# Patient Record
Sex: Male | Born: 1995 | Race: Black or African American | Hispanic: No | Marital: Single | Smoking: Never smoker
Health system: Southern US, Community
[De-identification: ages and names within clinical notes are randomized; demographics above are authoritative.]

## PROBLEM LIST (undated history)

## (undated) DIAGNOSIS — K219 Gastro-esophageal reflux disease without esophagitis: Secondary | ICD-10-CM

## (undated) DIAGNOSIS — L0291 Cutaneous abscess, unspecified: Secondary | ICD-10-CM

## (undated) DIAGNOSIS — K649 Unspecified hemorrhoids: Secondary | ICD-10-CM

## (undated) HISTORY — PX: WISDOM TOOTH EXTRACTION: SHX21

## (undated) HISTORY — PX: SEPTOPLASTY: SUR1290

---

## 2015-02-19 ENCOUNTER — Emergency Department (HOSPITAL_COMMUNITY)
Admission: EM | Admit: 2015-02-19 | Discharge: 2015-02-19 | Disposition: A | Discharge status: Home / Self Care | Payer: Self-pay

## 2017-02-16 ENCOUNTER — Other Ambulatory Visit: Payer: Self-pay

## 2017-02-16 ENCOUNTER — Ambulatory Visit (HOSPITAL_COMMUNITY)
Admission: EM | Admit: 2017-02-16 | Discharge: 2017-02-16 | Disposition: A | Discharge status: Home / Self Care | Payer: Federal, State, Local not specified - PPO | Attending: Family Medicine | Admitting: Family Medicine

## 2017-02-16 ENCOUNTER — Encounter (HOSPITAL_COMMUNITY): Payer: Self-pay | Admitting: Emergency Medicine

## 2017-02-16 DIAGNOSIS — L0291 Cutaneous abscess, unspecified: Secondary | ICD-10-CM | POA: Diagnosis not present

## 2017-02-16 MED ORDER — LIDOCAINE HCL 2 % IJ SOLN
INTRAMUSCULAR | Status: AC
  Filled 2017-02-16: qty 20

## 2017-02-16 MED ORDER — LIDOCAINE HCL (PF) 2 % IJ SOLN
INTRAMUSCULAR | Status: AC
  Filled 2017-02-16: qty 2

## 2017-02-16 NOTE — ED Triage Notes (Signed)
Patient states he has an abscess to right areola.  Noticed 2 days ago, the pain.  Noticed bump yesterday.  Patient has a history of abscess

## 2017-02-17 NOTE — ED Provider Notes (Signed)
  [facility]   [id number] 02/16/17 Arrival Time: 1318  ASSESSMENT & PLAN:  1. Abscess    Procedure: Verbal consent obtained. Area over induration cleaned with betadine. Lidocaine 2% without epinephrine used to obtain local anesthesia. The most fluctuant portion of the abscess was incised with a #11 blade scalpel. Small amount of purulent drainage. Abscess cavity explored and evacuated. Loculations broken up with a curved hemostat as best as possible given patient discomfort. Cavity packed with packing material and dressed with a clean gauze dressing. Minimal bleeding. No complications.  Wound care instructions discussed and given in written format. To return in 48 hours for wound check.  OTC analgesics as needed.  Reviewed expectations re: course of current medical issues. Questions answered. Outlined signs and symptoms indicating need for more acute intervention. Patient verbalized understanding. After Visit Summary given.   SUBJECTIVE:  Anthony Prince is a 22 y.o. male who presents with a possible abscess of his L areola. Noticed a couple of days ago. Increasing sharp and dull pain. No drainage. Attempt to self drain unsuccessful. Afebrile. H/O abscess in L axilla as a child.  ROS: As per HPI.  OBJECTIVE:  Vitals:   02/16/17 1356  BP: 120/69  Pulse: 63  Resp: 18  Temp: 98 F (36.7 C)  TempSrc: Oral  SpO2: 100%     General appearance: alert; no distress Skin: 1 cm induration of inferior L areola; tender; no active drainage Psychological: alert and cooperative; normal mood and affect  No Known Allergies         Mardella LaymanHagler, Kahner Yanik, MD 02/17/17 [id number]

## 2017-03-22 ENCOUNTER — Ambulatory Visit: Payer: Self-pay | Admitting: General Surgery

## 2017-03-22 ENCOUNTER — Other Ambulatory Visit: Payer: Self-pay

## 2017-03-22 ENCOUNTER — Encounter (HOSPITAL_BASED_OUTPATIENT_CLINIC_OR_DEPARTMENT_OTHER): Payer: Self-pay

## 2017-03-22 NOTE — H&P (View-Only) (Signed)
History of Present Illness Romie Levee MD; 03/22/2017 12:17 PM) The patient is a 22 year old male who presents with anal pain. 22 year old male who presents to the office for evaluation of anal pain and bleeding. He was diagnosed with hemorrhoids in the [employer] approximately 1 year ago. Since then his symptoms have been rather intermittent. Over the past few months they have worsened and become more regular. He reports regular bowel habits and occasional straining. He reports bleeding with almost every bowel movement. Bleeding occurs even without any pain. He was seen in the office approximately one month ago and rubber band ligation was performed. He reports no change in his symptoms after this.   Problem List/Past Medical Romie Levee, MD; 03/22/2017 12:18 PM) PROLAPSED INTERNAL HEMORRHOIDS, GRADE 2 (K64.1)  Past Surgical History Romie Levee, MD; 03/22/2017 12:18 PM) No pertinent past surgical history  Diagnostic Studies History Romie Levee, MD; 03/22/2017 12:18 PM) Colonoscopy never  Allergies (Tanisha A. Manson Passey, RMA; 03/22/2017 12:08 PM) No Known Drug Allergies [02/22/2017]: Allergies Reconciled  Medication History (Tanisha A. Manson Passey, RMA; 03/22/2017 12:08 PM) No Current Medications Medications Reconciled  Social History Romie Levee, MD; 03/22/2017 12:18 PM) Alcohol use Occasional alcohol use. Caffeine use Tea. No drug use Tobacco use Never smoker.  Family History Romie Levee, MD; 03/22/2017 12:18 PM) Arthritis Mother.  Other Problems Romie Levee, MD; 03/22/2017 12:18 PM) Gastroesophageal Reflux Disease Hemorrhoids     Review of Systems Romie Levee MD; 03/22/2017 12:18 PM) General Not Present- Appetite Loss, Chills, Fatigue, Fever, Night Sweats, Weight Gain and Weight Loss. Skin Not Present- Change in Wart/Mole, Dryness, Hives, Jaundice, New Lesions, Non-Healing Wounds, Rash and Ulcer. HEENT Not Present- Earache, Hearing Loss, Hoarseness, Nose  Bleed, Oral Ulcers, Ringing in the Ears, Seasonal Allergies, Sinus Pain, Sore Throat, Visual Disturbances, Wears glasses/contact lenses and Yellow Eyes. Respiratory Not Present- Bloody sputum, Chronic Cough, Difficulty Breathing, Snoring and Wheezing. Breast Not Present- Breast Mass, Breast Pain, Nipple Discharge and Skin Changes. Cardiovascular Not Present- Chest Pain, Difficulty Breathing Lying Down, Leg Cramps, Palpitations, Rapid Heart Rate, Shortness of Breath and Swelling of Extremities. Gastrointestinal Present- Bloody Stool, Change in Bowel Habits, Hemorrhoids and Rectal Pain. Not Present- Abdominal Pain, Bloating, Chronic diarrhea, Constipation, Difficulty Swallowing, Excessive gas, Gets full quickly at meals, Indigestion, Nausea and Vomiting. Male Genitourinary Not Present- Blood in Urine, Change in Urinary Stream, Frequency, Impotence, Nocturia, Painful Urination, Urgency and Urine Leakage. Musculoskeletal Not Present- Back Pain, Joint Pain, Joint Stiffness, Muscle Pain, Muscle Weakness and Swelling of Extremities. Neurological Present- Trouble walking. Not Present- Decreased Memory, Fainting, Headaches, Numbness, Seizures, Tingling, Tremor and Weakness. Psychiatric Present- Change in Sleep Pattern. Not Present- Anxiety, Bipolar, Depression, Fearful and Frequent crying. Endocrine Not Present- Cold Intolerance, Excessive Hunger, Hair Changes, Heat Intolerance and New Diabetes. Hematology Not Present- Blood Thinners, Easy Bruising, Excessive bleeding, Gland problems, HIV and Persistent Infections.  Vitals (Tanisha A. Brown RMA; 03/22/2017 12:08 PM) 03/22/2017 12:08 PM Weight: 202 lb Height: 74in Body Surface Area: 2.18 m Body Mass Index: 25.93 kg/m  Temp.: 98.47F  Pulse: 75 (Regular)  BP: 126/84 (Sitting, Left Arm, Standard)      Physical Exam Romie Levee MD; 03/22/2017 12:18 PM)  General Mental Status-Alert. General Appearance-Not in acute distress. Build &  Nutrition-Well nourished. Posture-Normal posture. Gait-Normal.  Head and Neck Head-normocephalic, atraumatic with no lesions or palpable masses. Trachea-midline.  Chest and Lung Exam Chest and lung exam reveals -on auscultation, normal breath sounds, no adventitious sounds and normal vocal resonance.  Cardiovascular Cardiovascular  examination reveals -normal heart sounds, regular rate and rhythm with no murmurs and no digital clubbing, cyanosis, edema, increased warmth or tenderness.  Abdomen Inspection Inspection of the abdomen reveals - No Hernias. Palpation/Percussion Palpation and Percussion of the abdomen reveal - Soft, Non Tender, No Rigidity (guarding), No hepatosplenomegaly and No Palpable abdominal masses.  Neurologic Neurologic evaluation reveals -alert and oriented x 3 with no impairment of recent or remote memory, normal attention span and ability to concentrate, normal sensation and normal coordination.  Musculoskeletal Normal Exam - Bilateral-Upper Extremity Strength Normal and Lower Extremity Strength Normal.    Assessment & Plan Romie Levee(Carrie Usery MD; 03/22/2017 12:17 PM)  PROLAPSED INTERNAL HEMORRHOIDS, GRADE 2 (K64.1) Impression: 22 year old male with bleeding internal hemorrhoids. He is status post rubber band ligation of his right posterior internal hemorrhoid. This did not change his symptoms at all. He is tried multiple forms of medical management as well. Given that he has failed these treatments, I recommended that he proceed with hemorrhoidectomy. We have discussed this in detail including postoperative pain and urinary retention as well as recurrence rates. We discussed hemorrhoidal pexy as well. He would like to proceed with standard hemorrhoidectomy. I think this will most likely be a one column hemorrhoid surgery and possible pexy on the other grade 2 hemorrhoid.

## 2017-03-22 NOTE — Progress Notes (Signed)
Spoke with:  Aidenjames NPO:  After Midnight, no gum, candy, or mints   Arrival time:  0900AM Labs:  Hemoglobin AM medications:  None Pre op orders:  Yes Ride home:  Anthony Prince (mom)  [phone]

## 2017-03-22 NOTE — H&P (Signed)
History of Present Illness Romie Levee MD; 03/22/2017 12:17 PM) The patient is a 22 year old male who presents with anal pain. 22 year old male who presents to the office for evaluation of anal pain and bleeding. He was diagnosed with hemorrhoids in the [employer] approximately 1 year ago. Since then his symptoms have been rather intermittent. Over the past few months they have worsened and become more regular. He reports regular bowel habits and occasional straining. He reports bleeding with almost every bowel movement. Bleeding occurs even without any pain. He was seen in the office approximately one month ago and rubber band ligation was performed. He reports no change in his symptoms after this.   Problem List/Past Medical Romie Levee, MD; 03/22/2017 12:18 PM) PROLAPSED INTERNAL HEMORRHOIDS, GRADE 2 (K64.1)  Past Surgical History Romie Levee, MD; 03/22/2017 12:18 PM) No pertinent past surgical history  Diagnostic Studies History Romie Levee, MD; 03/22/2017 12:18 PM) Colonoscopy never  Allergies (Tanisha A. Manson Passey, RMA; 03/22/2017 12:08 PM) No Known Drug Allergies [02/22/2017]: Allergies Reconciled  Medication History (Tanisha A. Manson Passey, RMA; 03/22/2017 12:08 PM) No Current Medications Medications Reconciled  Social History Romie Levee, MD; 03/22/2017 12:18 PM) Alcohol use Occasional alcohol use. Caffeine use Tea. No drug use Tobacco use Never smoker.  Family History Romie Levee, MD; 03/22/2017 12:18 PM) Arthritis Mother.  Other Problems Romie Levee, MD; 03/22/2017 12:18 PM) Gastroesophageal Reflux Disease Hemorrhoids     Review of Systems Romie Levee MD; 03/22/2017 12:18 PM) General Not Present- Appetite Loss, Chills, Fatigue, Fever, Night Sweats, Weight Gain and Weight Loss. Skin Not Present- Change in Wart/Mole, Dryness, Hives, Jaundice, New Lesions, Non-Healing Wounds, Rash and Ulcer. HEENT Not Present- Earache, Hearing Loss, Hoarseness, Nose  Bleed, Oral Ulcers, Ringing in the Ears, Seasonal Allergies, Sinus Pain, Sore Throat, Visual Disturbances, Wears glasses/contact lenses and Yellow Eyes. Respiratory Not Present- Bloody sputum, Chronic Cough, Difficulty Breathing, Snoring and Wheezing. Breast Not Present- Breast Mass, Breast Pain, Nipple Discharge and Skin Changes. Cardiovascular Not Present- Chest Pain, Difficulty Breathing Lying Down, Leg Cramps, Palpitations, Rapid Heart Rate, Shortness of Breath and Swelling of Extremities. Gastrointestinal Present- Bloody Stool, Change in Bowel Habits, Hemorrhoids and Rectal Pain. Not Present- Abdominal Pain, Bloating, Chronic diarrhea, Constipation, Difficulty Swallowing, Excessive gas, Gets full quickly at meals, Indigestion, Nausea and Vomiting. Male Genitourinary Not Present- Blood in Urine, Change in Urinary Stream, Frequency, Impotence, Nocturia, Painful Urination, Urgency and Urine Leakage. Musculoskeletal Not Present- Back Pain, Joint Pain, Joint Stiffness, Muscle Pain, Muscle Weakness and Swelling of Extremities. Neurological Present- Trouble walking. Not Present- Decreased Memory, Fainting, Headaches, Numbness, Seizures, Tingling, Tremor and Weakness. Psychiatric Present- Change in Sleep Pattern. Not Present- Anxiety, Bipolar, Depression, Fearful and Frequent crying. Endocrine Not Present- Cold Intolerance, Excessive Hunger, Hair Changes, Heat Intolerance and New Diabetes. Hematology Not Present- Blood Thinners, Easy Bruising, Excessive bleeding, Gland problems, HIV and Persistent Infections.  Vitals (Tanisha A. Brown RMA; 03/22/2017 12:08 PM) 03/22/2017 12:08 PM Weight: 202 lb Height: 74in Body Surface Area: 2.18 m Body Mass Index: 25.93 kg/m  Temp.: 98.47F  Pulse: 75 (Regular)  BP: 126/84 (Sitting, Left Arm, Standard)      Physical Exam Romie Levee MD; 03/22/2017 12:18 PM)  General Mental Status-Alert. General Appearance-Not in acute distress. Build &  Nutrition-Well nourished. Posture-Normal posture. Gait-Normal.  Head and Neck Head-normocephalic, atraumatic with no lesions or palpable masses. Trachea-midline.  Chest and Lung Exam Chest and lung exam reveals -on auscultation, normal breath sounds, no adventitious sounds and normal vocal resonance.  Cardiovascular Cardiovascular  examination reveals -normal heart sounds, regular rate and rhythm with no murmurs and no digital clubbing, cyanosis, edema, increased warmth or tenderness.  Abdomen Inspection Inspection of the abdomen reveals - No Hernias. Palpation/Percussion Palpation and Percussion of the abdomen reveal - Soft, Non Tender, No Rigidity (guarding), No hepatosplenomegaly and No Palpable abdominal masses.  Neurologic Neurologic evaluation reveals -alert and oriented x 3 with no impairment of recent or remote memory, normal attention span and ability to concentrate, normal sensation and normal coordination.  Musculoskeletal Normal Exam - Bilateral-Upper Extremity Strength Normal and Lower Extremity Strength Normal.    Assessment & Plan Romie Levee(Emalene Welte MD; 03/22/2017 12:17 PM)  PROLAPSED INTERNAL HEMORRHOIDS, GRADE 2 (K64.1) Impression: 22 year old male with bleeding internal hemorrhoids. He is status post rubber band ligation of his right posterior internal hemorrhoid. This did not change his symptoms at all. He is tried multiple forms of medical management as well. Given that he has failed these treatments, I recommended that he proceed with hemorrhoidectomy. We have discussed this in detail including postoperative pain and urinary retention as well as recurrence rates. We discussed hemorrhoidal pexy as well. He would like to proceed with standard hemorrhoidectomy. I think this will most likely be a one column hemorrhoid surgery and possible pexy on the other grade 2 hemorrhoid.

## 2017-03-31 ENCOUNTER — Ambulatory Visit (HOSPITAL_BASED_OUTPATIENT_CLINIC_OR_DEPARTMENT_OTHER)
Admission: RE | Admit: 2017-03-31 | Discharge: 2017-03-31 | Disposition: A | Discharge status: Home / Self Care | Payer: Federal, State, Local not specified - PPO | Source: Ambulatory Visit | Attending: General Surgery | Admitting: General Surgery

## 2017-03-31 ENCOUNTER — Encounter (HOSPITAL_BASED_OUTPATIENT_CLINIC_OR_DEPARTMENT_OTHER): Payer: Self-pay | Admitting: Anesthesiology

## 2017-03-31 ENCOUNTER — Ambulatory Visit (HOSPITAL_BASED_OUTPATIENT_CLINIC_OR_DEPARTMENT_OTHER): Payer: Federal, State, Local not specified - PPO | Admitting: Anesthesiology

## 2017-03-31 ENCOUNTER — Encounter (HOSPITAL_BASED_OUTPATIENT_CLINIC_OR_DEPARTMENT_OTHER)
Admission: RE | Disposition: A | Discharge status: Home / Self Care | Payer: Self-pay | Source: Ambulatory Visit | Attending: General Surgery

## 2017-03-31 ENCOUNTER — Other Ambulatory Visit: Payer: Self-pay

## 2017-03-31 DIAGNOSIS — K219 Gastro-esophageal reflux disease without esophagitis: Secondary | ICD-10-CM | POA: Insufficient documentation

## 2017-03-31 DIAGNOSIS — K625 Hemorrhage of anus and rectum: Secondary | ICD-10-CM | POA: Diagnosis not present

## 2017-03-31 DIAGNOSIS — K642 Third degree hemorrhoids: Secondary | ICD-10-CM | POA: Diagnosis not present

## 2017-03-31 HISTORY — DX: Gastro-esophageal reflux disease without esophagitis: K21.9

## 2017-03-31 HISTORY — DX: Cutaneous abscess, unspecified: L02.91

## 2017-03-31 HISTORY — PX: HEMORRHOID SURGERY: SHX153

## 2017-03-31 HISTORY — DX: Unspecified hemorrhoids: K64.9

## 2017-03-31 SURGERY — HEMORRHOIDECTOMY
Anesthesia: Monitor Anesthesia Care | Site: Rectum

## 2017-03-31 MED ORDER — LIDOCAINE HCL (CARDIAC) 20 MG/ML IV SOLN
INTRAVENOUS | Status: DC | PRN
  Administered 2017-03-31: 100 mg via INTRAVENOUS

## 2017-03-31 MED ORDER — GLYCOPYRROLATE 0.2 MG/ML IJ SOLN
INTRAMUSCULAR | Status: DC | PRN
  Administered 2017-03-31 (×2): 0.2 mg via INTRAVENOUS

## 2017-03-31 MED ORDER — CELECOXIB 200 MG PO CAPS
ORAL_CAPSULE | ORAL | Status: AC
  Filled 2017-03-31: qty 1

## 2017-03-31 MED ORDER — OXYCODONE HCL 5 MG PO TABS
5.0000 mg | ORAL_TABLET | ORAL | Status: DC | PRN
  Filled 2017-03-31: qty 2

## 2017-03-31 MED ORDER — ACETAMINOPHEN 650 MG RE SUPP
650.0000 mg | RECTAL | Status: DC | PRN
  Filled 2017-03-31: qty 1

## 2017-03-31 MED ORDER — LIDOCAINE 5 % EX OINT
TOPICAL_OINTMENT | CUTANEOUS | Status: DC | PRN
  Administered 2017-03-31: 1

## 2017-03-31 MED ORDER — ONDANSETRON HCL 4 MG/2ML IJ SOLN
INTRAMUSCULAR | Status: DC | PRN
  Administered 2017-03-31: 4 mg via INTRAVENOUS

## 2017-03-31 MED ORDER — MIDAZOLAM HCL 5 MG/5ML IJ SOLN
INTRAMUSCULAR | Status: DC | PRN
  Administered 2017-03-31: 2 mg via INTRAVENOUS
  Administered 2017-03-31: 1 mg via INTRAVENOUS

## 2017-03-31 MED ORDER — PROMETHAZINE HCL 25 MG/ML IJ SOLN
6.2500 mg | INTRAMUSCULAR | Status: DC | PRN
  Filled 2017-03-31: qty 1

## 2017-03-31 MED ORDER — SODIUM CHLORIDE 0.9% FLUSH
3.0000 mL | INTRAVENOUS | Status: DC | PRN
  Filled 2017-03-31: qty 3

## 2017-03-31 MED ORDER — OXYCODONE HCL 5 MG PO TABS: 5.0000 mg | ORAL_TABLET | Freq: Four times a day (QID) | ORAL | 0 refills | Status: DC | PRN

## 2017-03-31 MED ORDER — PROPOFOL 500 MG/50ML IV EMUL
INTRAVENOUS | Status: DC | PRN
  Administered 2017-03-31: 150 ug/kg/min via INTRAVENOUS

## 2017-03-31 MED ORDER — BUPIVACAINE-EPINEPHRINE 0.5% -1:200000 IJ SOLN
INTRAMUSCULAR | Status: DC | PRN
  Administered 2017-03-31: 30 mL

## 2017-03-31 MED ORDER — CELECOXIB 200 MG PO CAPS
200.0000 mg | ORAL_CAPSULE | ORAL | Status: AC
  Administered 2017-03-31: 200 mg via ORAL
  Filled 2017-03-31: qty 1

## 2017-03-31 MED ORDER — BUPIVACAINE LIPOSOME 1.3 % IJ SUSP
20.0000 mL | INTRAMUSCULAR | Status: DC
  Filled 2017-03-31: qty 20

## 2017-03-31 MED ORDER — MIDAZOLAM HCL 2 MG/2ML IJ SOLN
INTRAMUSCULAR | Status: AC
  Filled 2017-03-31: qty 2

## 2017-03-31 MED ORDER — BUPIVACAINE LIPOSOME 1.3 % IJ SUSP
INTRAMUSCULAR | Status: DC | PRN
  Administered 2017-03-31: 20 mL

## 2017-03-31 MED ORDER — ONDANSETRON HCL 4 MG/2ML IJ SOLN
INTRAMUSCULAR | Status: AC
  Filled 2017-03-31: qty 2

## 2017-03-31 MED ORDER — GABAPENTIN 300 MG PO CAPS
300.0000 mg | ORAL_CAPSULE | ORAL | Status: AC
  Administered 2017-03-31: 300 mg via ORAL
  Filled 2017-03-31: qty 1

## 2017-03-31 MED ORDER — LIDOCAINE 2% (20 MG/ML) 5 ML SYRINGE
INTRAMUSCULAR | Status: AC
  Filled 2017-03-31: qty 5

## 2017-03-31 MED ORDER — LACTATED RINGERS IV SOLN
INTRAVENOUS | Status: DC
  Administered 2017-03-31 (×2): via INTRAVENOUS
  Filled 2017-03-31: qty 1000

## 2017-03-31 MED ORDER — SODIUM CHLORIDE 0.9% FLUSH
3.0000 mL | Freq: Two times a day (BID) | INTRAVENOUS | Status: DC
  Filled 2017-03-31: qty 3

## 2017-03-31 MED ORDER — FENTANYL CITRATE (PF) 100 MCG/2ML IJ SOLN
25.0000 ug | INTRAMUSCULAR | Status: DC | PRN
  Filled 2017-03-31: qty 1

## 2017-03-31 MED ORDER — FENTANYL CITRATE (PF) 100 MCG/2ML IJ SOLN
INTRAMUSCULAR | Status: AC
  Filled 2017-03-31: qty 2

## 2017-03-31 MED ORDER — ACETAMINOPHEN 500 MG PO TABS
ORAL_TABLET | ORAL | Status: AC
  Filled 2017-03-31: qty 2

## 2017-03-31 MED ORDER — SODIUM CHLORIDE 0.9 % IV SOLN
250.0000 mL | INTRAVENOUS | Status: DC | PRN
  Filled 2017-03-31: qty 250

## 2017-03-31 MED ORDER — GABAPENTIN 300 MG PO CAPS
ORAL_CAPSULE | ORAL | Status: AC
  Filled 2017-03-31: qty 1

## 2017-03-31 MED ORDER — FENTANYL CITRATE (PF) 100 MCG/2ML IJ SOLN
INTRAMUSCULAR | Status: DC | PRN
  Administered 2017-03-31: 50 ug via INTRAVENOUS

## 2017-03-31 MED ORDER — DEXAMETHASONE SODIUM PHOSPHATE 4 MG/ML IJ SOLN
INTRAMUSCULAR | Status: DC | PRN
  Administered 2017-03-31: 10 mg via INTRAVENOUS

## 2017-03-31 MED ORDER — GLYCOPYRROLATE 0.2 MG/ML IV SOSY
PREFILLED_SYRINGE | INTRAVENOUS | Status: AC
  Filled 2017-03-31: qty 5

## 2017-03-31 MED ORDER — DEXAMETHASONE SODIUM PHOSPHATE 10 MG/ML IJ SOLN
INTRAMUSCULAR | Status: AC
  Filled 2017-03-31: qty 1

## 2017-03-31 MED ORDER — ACETAMINOPHEN 325 MG PO TABS
650.0000 mg | ORAL_TABLET | ORAL | Status: DC | PRN
  Filled 2017-03-31: qty 2

## 2017-03-31 MED ORDER — ACETAMINOPHEN 500 MG PO TABS
1000.0000 mg | ORAL_TABLET | ORAL | Status: AC
  Administered 2017-03-31: 1000 mg via ORAL
  Filled 2017-03-31: qty 2

## 2017-03-31 SURGICAL SUPPLY — 49 items
BLADE EXTENDED COATED 6.5IN (ELECTRODE) ×3 IMPLANT
BLADE HEX COATED 2.75 (ELECTRODE) ×3 IMPLANT
BLADE SURG 10 STRL SS (BLADE) IMPLANT
BLADE SURG 15 STRL LF DISP TIS (BLADE) IMPLANT
BLADE SURG 15 STRL SS (BLADE)
BRIEF STRETCH FOR OB PAD LRG (UNDERPADS AND DIAPERS) ×3 IMPLANT
COVER BACK TABLE 60X90IN (DRAPES) ×3 IMPLANT
COVER MAYO STAND STRL (DRAPES) ×3 IMPLANT
DRAPE LAPAROTOMY 100X72 PEDS (DRAPES) ×3 IMPLANT
DRAPE UTILITY XL STRL (DRAPES) ×3 IMPLANT
ELECT REM PT RETURN 9FT ADLT (ELECTROSURGICAL) ×3
ELECTRODE REM PT RTRN 9FT ADLT (ELECTROSURGICAL) ×1 IMPLANT
GAUZE SPONGE 4X4 12PLY STRL (GAUZE/BANDAGES/DRESSINGS) ×3 IMPLANT
GAUZE SPONGE 4X4 16PLY XRAY LF (GAUZE/BANDAGES/DRESSINGS) ×3 IMPLANT
GLOVE BIO SURGEON STRL SZ 6.5 (GLOVE) ×2 IMPLANT
GLOVE BIO SURGEONS STRL SZ 6.5 (GLOVE) ×1
GLOVE BIOGEL PI IND STRL 7.5 (GLOVE) ×1 IMPLANT
GLOVE BIOGEL PI IND STRL 8.5 (GLOVE) ×1 IMPLANT
GLOVE BIOGEL PI INDICATOR 7.5 (GLOVE) ×2
GLOVE BIOGEL PI INDICATOR 8.5 (GLOVE) ×2
GLOVE INDICATOR 7.0 STRL GRN (GLOVE) ×3 IMPLANT
GLOVE SURG SS PI 8.5 STRL IVOR (GLOVE) ×2
GLOVE SURG SS PI 8.5 STRL STRW (GLOVE) ×1 IMPLANT
GOWN STRL REUS W/TWL 2XL LVL3 (GOWN DISPOSABLE) ×3 IMPLANT
GOWN STRL REUS W/TWL XL LVL3 (GOWN DISPOSABLE) ×3 IMPLANT
KIT RM TURNOVER CYSTO AR (KITS) ×3 IMPLANT
NEEDLE HYPO 22GX1.5 SAFETY (NEEDLE) ×3 IMPLANT
NS IRRIG 500ML POUR BTL (IV SOLUTION) ×3 IMPLANT
PACK BASIN DAY SURGERY FS (CUSTOM PROCEDURE TRAY) ×3 IMPLANT
PAD ABD 8X10 STRL (GAUZE/BANDAGES/DRESSINGS) ×3 IMPLANT
PAD ARMBOARD 7.5X6 YLW CONV (MISCELLANEOUS) IMPLANT
PENCIL BUTTON HOLSTER BLD 10FT (ELECTRODE) ×3 IMPLANT
SPONGE SURGIFOAM ABS GEL 100 (HEMOSTASIS) IMPLANT
SPONGE SURGIFOAM ABS GEL 12-7 (HEMOSTASIS) IMPLANT
SUT CHROMIC 2 0 SH (SUTURE) ×3 IMPLANT
SUT CHROMIC 3 0 SH 27 (SUTURE) ×3 IMPLANT
SUT VIC AB 2-0 SH 27 (SUTURE)
SUT VIC AB 2-0 SH 27XBRD (SUTURE) IMPLANT
SUT VIC AB 3-0 SH 27 (SUTURE) ×2
SUT VIC AB 3-0 SH 27X BRD (SUTURE) ×1 IMPLANT
SUT VIC AB 4-0 P-3 18XBRD (SUTURE) IMPLANT
SUT VIC AB 4-0 P3 18 (SUTURE)
SUT VIC AB 4-0 SH 18 (SUTURE) IMPLANT
SYR CONTROL 10ML LL (SYRINGE) ×3 IMPLANT
TRAY DSU PREP LF (CUSTOM PROCEDURE TRAY) ×3 IMPLANT
TUBE CONNECTING 12'X1/4 (SUCTIONS) ×1
TUBE CONNECTING 12X1/4 (SUCTIONS) ×2 IMPLANT
WATER STERILE IRR 500ML POUR (IV SOLUTION) IMPLANT
YANKAUER SUCT BULB TIP NO VENT (SUCTIONS) ×3 IMPLANT

## 2017-03-31 NOTE — Anesthesia Preprocedure Evaluation (Signed)
Anesthesia Evaluation  Patient identified by MRN, date of birth, ID band Patient awake    Reviewed: Allergy & Precautions, NPO status , Patient's Chart, lab work & pertinent test results  Airway Mallampati: II  TM Distance: >3 FB Neck ROM: Full    Dental no notable dental hx.    Pulmonary neg pulmonary ROS,    Pulmonary exam normal breath sounds clear to auscultation       Cardiovascular negative cardio ROS Normal cardiovascular exam Rhythm:Regular Rate:Normal     Neuro/Psych negative neurological ROS  negative psych ROS   GI/Hepatic Neg liver ROS, GERD  ,  Endo/Other  negative endocrine ROS  Renal/GU negative Renal ROS  negative genitourinary   Musculoskeletal negative musculoskeletal ROS (+)   Abdominal   Peds negative pediatric ROS (+)  Hematology negative hematology ROS (+)   Anesthesia Other Findings   Reproductive/Obstetrics negative OB ROS                             Anesthesia Physical Anesthesia Plan  ASA: II  Anesthesia Plan: MAC   Post-op Pain Management:    Induction: Intravenous  PONV Risk Score and Plan: 0  Airway Management Planned: Simple Face Mask  Additional Equipment:   Intra-op Plan:   Post-operative Plan:   Informed Consent: I have reviewed the patients History and Physical, chart, labs and discussed the procedure including the risks, benefits and alternatives for the proposed anesthesia with the patient or authorized representative who has indicated his/her understanding and acceptance.   Dental advisory given  Plan Discussed with: CRNA and Surgeon  Anesthesia Plan Comments:         Anesthesia Quick Evaluation  

## 2017-03-31 NOTE — Op Note (Addendum)
03/31/2017  11:08 AM  PATIENT:  Anthony Prince  22 y.o. male  Patient Care Team: System, Pcp Not In as PCP - General  PRE-OPERATIVE DIAGNOSIS:  rectal bleeding  POST-OPERATIVE DIAGNOSIS:  Rectal bleeding  PROCEDURE:  HEMORRHOIDECTOMY, L LATERAL HEMORRHOID AND HEMORRHOIDAL PEXY R POSTERIOR HEMORRHOID   Surgeon(s): Leighton Ruff, MD  ASSISTANT: none   ANESTHESIA:   local and MAC  SPECIMEN:  Source of Specimen:  L lateral hemorrhoid  DISPOSITION OF SPECIMEN:  PATHOLOGY  COUNTS:  YES  PLAN OF CARE: Discharge to home after PACU  PATIENT DISPOSITION:  PACU - hemodynamically stable.  INDICATION: 22 year old male with a grade 3 internal hemorrhoid and rectal bleeding.  He underwent rubber band ligation in the office but continued to have bleeding.  We decided to perform a hemorrhoidectomy.   OR FINDINGS: Grade 3 left lateral internal hemorrhoid, grade 2 right posterior internal hemorrhoid  DESCRIPTION: the patient was identified in the preoperative holding area and taken to the OR where they were laid on the operating room table.  MAC anesthesia was induced without difficulty. The patient was then positioned in prone jackknife position with buttocks gently taped apart.  The patient was then prepped and draped in usual sterile fashion.  SCDs were noted to be in place prior to the initiation of anesthesia. A surgical timeout was performed indicating the correct patient, procedure, positioning and need for preoperative antibiotics.  A rectal block was performed using Marcaine with epinephrine mixed with Experel.    I began with a digital rectal exam.  There were no masses noted.  I then placed a Hill-Ferguson anoscope into the anal canal and evaluated this completely.  The patient had a grade 3 left lateral internal hemorrhoid and a grade 2 right posterior hemorrhoid.  There was no hemorrhoid disease noted in the right anterior position.  I began by elevating the left lateral hemorrhoid  with clamp.  I then divided the anoderm using Metzenbaum scissors.  I identified the sphincter complex and dissected all the hemorrhoidal tissue away from this.  The mucosal edges were then trimmed and the hemorrhoid was removed.  The mucosal edges were then reapproximated using a running 2-0 chromic suture.  I then turned my attention to the grade 2 hemorrhoid on the right side.  I decided to perform a hemorrhoidal pexy.  This was done with a 3-0 Vicryl suture.  Once this was completed, I inspected for hemostasis.  There was a small amount of bleeding from the suture line on the left side and a 3-0 chromic suture was used to control this.  Once this was complete, lidocaine ointment was applied.  A sterile dressing was applied over this.  The patient was then awakened from anesthesia and sent to the postanesthesia care unit stable condition.  All counts were correct per operating room staff.   I have reviewed the [facility] [place] and the patient has one other narcotic prescription listed.  This was active in September 2018.

## 2017-03-31 NOTE — Interval H&P Note (Signed)
History and Physical Interval Note:  03/31/2017 10:23 AM  Anthony Prince  has presented today for surgery, with the diagnosis of rectal bleeding  The various methods of treatment have been discussed with the patient and family. After consideration of risks, benefits and other options for treatment, the patient has consented to  Procedure(s): HEMORRHOIDECTOMY (N/A) as a surgical intervention .  The patient's history has been reviewed, patient examined, no change in status, stable for surgery.  I have reviewed the patient's chart and labs.  Questions were answered to the patient's satisfaction.     Vanita PandaAlicia C Tenille Morrill, MD  Colorectal and General Surgery [facility]

## 2017-03-31 NOTE — Anesthesia Procedure Notes (Signed)
Procedure Name: MAC Date/Time: 03/31/2017 9:38 AM Performed by: Myrtie Soman, MD Pre-anesthesia Checklist: Patient identified, Emergency Drugs available, Suction available, Patient being monitored and Timeout performed Oxygen Delivery Method: Nasal cannula Placement Confirmation: positive ETCO2,  CO2 detector and breath sounds checked- equal and bilateral

## 2017-03-31 NOTE — Anesthesia Postprocedure Evaluation (Signed)
Anesthesia Post Note  Patient: [employer]  Procedure(s) Performed: HEMORRHOIDECTOMY (N/A Rectum)     Patient location during evaluation: PACU Anesthesia Type: MAC Level of consciousness: awake and alert Pain management: pain level controlled Vital Signs Assessment: post-procedure vital signs reviewed and stable Respiratory status: spontaneous breathing, nonlabored ventilation, respiratory function stable and patient connected to nasal cannula oxygen Cardiovascular status: stable and blood pressure returned to baseline Postop Assessment: no apparent nausea or vomiting Anesthetic complications: no    Last Vitals:  Vitals:   03/31/17 1115 03/31/17 1130  BP: 131/68 126/65  Pulse: 77 72  Resp: 19 20  Temp: 36.6 C   SpO2: 100% 100%    Last Pain:  Vitals:   03/31/17 0901  TempSrc: Oral                 Ander Wamser S

## 2017-03-31 NOTE — Discharge Instructions (Addendum)
ANORECTAL SURGERY: POST OP INSTRUCTIONS °1. Take your usually prescribed home medications unless otherwise directed. °2. DIET: During the first few hours after surgery sip on some liquids until you are able to urinate.  It is normal to not urinate for several hours after this surgery.  If you feel uncomfortable, please contact the office for instructions.  After you are able to urinate,you may eat, if you feel like it.  Follow a light bland diet the first 24 hours after arrival home, such as soup, liquids, crackers, etc.  Be sure to include lots of fluids daily (6-8 glasses).  Avoid fast food or heavy meals, as your are more likely to get nauseated.  Eat a low fat diet the next few days after surgery.  Limit caffeine intake to 1-2 servings a day. °3. PAIN CONTROL: °a. Pain is best controlled by a usual combination of several different methods TOGETHER: °i. Muscle relaxation Soak in a warm bath (or Sitz bath) three times a day and after bowel movements.  Continue to do this until all pain is resolved. °ii. Over the counter pain medication °iii. Prescription pain medication °b. Most patients will experience some swelling and discomfort in the anus/rectal area and incisions.  Heat such as warm towels, sitz baths, warm baths, etc to help relax tight/sore spots and speed recovery.  Some people prefer to use ice, especially in the first couple days after surgery, as it may decrease the pain and swelling, or alternate between ice & heat.  Experiment to what works for you.  Swelling and bruising can take several weeks to resolve.  Pain can take even longer to completely resolve. °c. It is helpful to take an over-the-counter pain medication regularly for the first few weeks.  Choose one of the following that works best for you: °i. Naproxen (Aleve, etc)  Two 220mg tabs twice a day °ii. Ibuprofen (Advil, etc) Three 200mg tabs four times a day (every meal & bedtime) °d. A  prescription for pain medication (such as percocet,  oxycodone, hydrocodone, etc) should be given to you upon discharge.  Take your pain medication as prescribed.  °i. If you are having problems/concerns with the prescription medicine (does not control pain, nausea, vomiting, rash, itching, etc), please call us [phone] to see if we need to switch you to a different pain medicine that will work better for you and/or control your side effect better. °ii. If you need a refill on your pain medication, please contact your pharmacy.  They will contact our office to request authorization. Prescriptions will not be filled after 5 pm or on week-ends. °4. KEEP YOUR BOWELS REGULAR and AVOID CONSTIPATION °a. The goal is one to two soft bowel movements a day.  You should at least have a bowel movement every other day. °b. Avoid getting constipated.  Between the surgery and the pain medications, it is common to experience some constipation. This can be very painful after rectal surgery.  Increasing fluid intake and taking a fiber supplement (such as Metamucil, Citrucel, FiberCon, etc) 1-2 times a day regularly will usually help prevent this problem from occurring.  A stool softener like colace is also recommended.  This can be purchased over the counter at your pharmacy.  You can take it up to 3 times a day.  If you do not have a bowel movement after 24 hrs since your surgery, take one does of milk of magnesia.  If you still haven't had a bowel movement 8-12 hours after   that dose, take another dose.  If you don't have a bowel movement 48 hrs after surgery, purchase a Fleets enema from the drug store and administer gently per package instructions.  If you still are having trouble with your bowel movements after that, please call the office for further instructions. °c. If you develop diarrhea or have many loose bowel movements, simplify your diet to bland foods & liquids for a few days.  Stop any stool softeners and decrease your fiber supplement.  Switching to mild  anti-diarrheal medications (Kayopectate, Pepto Bismol) can help.  If this worsens or does not improve, please call us. ° °5. Wound Care °a. Remove your bandages before your first bowel movement or 8 hours after surgery.     °b. Remove any wound packing material at this tim,e as well.  You do not need to repack the wound unless instructed otherwise.  Wear an absorbent pad or soft cotton gauze in your underwear to catch any drainage and help keep the area clean. You should change this every 2-3 hours while awake. °c. Keep the area clean and dry.  Bathe / shower every day, especially after bowel movements.  Keep the area clean by showering / bathing over the incision / wound.   It is okay to soak an open wound to help wash it.  Wet wipes or showers / gentle washing after bowel movements is often less traumatic than regular toilet paper. °d. You may have some styrofoam-like soft packing in the rectum which will come out with the first bowel movement.  °e. You will often notice bleeding with bowel movements.  This should slow down by the end of the first week of surgery °f. Expect some drainage.  This should slow down, too, by the end of the first week of surgery.  Wear an absorbent pad or soft cotton gauze in your underwear until the drainage stops. °g. Do Not sit on a rubber or pillow ring.  This can make you symptoms worse.  You may sit on a soft pillow if needed.  °6. ACTIVITIES as tolerated:   °a. You may resume regular (light) daily activities beginning the next day--such as daily self-care, walking, climbing stairs--gradually increasing activities as tolerated.  If you can walk 30 minutes without difficulty, it is safe to try more intense activity such as jogging, treadmill, bicycling, low-impact aerobics, swimming, etc. °b. Save the most intensive and strenuous activity for last such as sit-ups, heavy lifting, contact sports, etc  Refrain from any heavy lifting or straining until you are off narcotics for pain  control.   °c. You may drive when you are no longer taking prescription pain medication, you can comfortably sit for long periods of time, and you can safely maneuver your car and apply brakes. °d. You may have sexual intercourse when it is comfortable.  °7. FOLLOW UP in our office °a. Please call [facility] at [phone] to set up an appointment to see your surgeon in the office for a follow-up appointment approximately 3-4 weeks after your surgery. °b. Make sure that you call for this appointment the day you arrive home to insure a convenient appointment time. °10. IF YOU HAVE DISABILITY OR FAMILY LEAVE FORMS, BRING THEM TO THE OFFICE FOR PROCESSING.  DO NOT GIVE THEM TO YOUR DOCTOR. ° ° ° ° °WHEN TO CALL US [phone]: °1. Poor pain control °2. Reactions / problems with new medications (rash/itching, nausea, etc)  °3. Fever over 101.5 F (38.5 C) °4.   Inability to urinate °5. Nausea and/or vomiting °6. Worsening swelling or bruising °7. Continued bleeding from incision. °8. Increased pain, redness, or drainage from the incision ° °The clinic staff is available to answer your questions during regular business hours (8:30am-5pm).  Please don’t hesitate to call and ask to speak to one of our nurses for clinical concerns.   A surgeon from [facility] is always on call at the hospitals °  °If you have a medical emergency, go to the nearest emergency room or call 911. °  ° °[facility] °[address], [address], [place], [place]  [address] ? °MAIN: [phone] ? TOLL FREE: [phone] ? °FAX [phone] °[email or web address] ° °Information for Discharge Teaching: °EXPAREL (bupivacaine liposome injectable suspension)  ° °Your surgeon gave you EXPAREL(bupivacaine) in your surgical incision to help control your pain after surgery.  °· EXPAREL is a local anesthetic that provides pain relief by numbing the tissue around the surgical site. °· EXPAREL is designed to release  pain medication over time and can control pain for up to 72 hours. °· Depending on how you respond to EXPAREL, you may require less pain medication during your recovery. ° °Possible side effects: °· Temporary loss of sensation or ability to move in the area where bupivacaine was injected. °· Nausea, vomiting, constipation °· Rarely, numbness and tingling in your mouth or lips, lightheadedness, or anxiety may occur. °· Call your doctor right away if you think you may be experiencing any of these sensations, or if you have other questions regarding possible side effects. ° °Follow all other discharge instructions given to you by your surgeon or nurse. Eat a healthy diet and drink plenty of water or other fluids. ° °If you return to the hospital for any reason within 96 hours following the administration of EXPAREL, please inform your health care providers. °Post Anesthesia Home Care Instructions ° °Activity: °Get plenty of rest for the remainder of the day. A responsible individual must stay with you for 24 hours following the procedure.  °For the next 24 hours, DO NOT: °-Drive a car °-Operate machinery °-Drink alcoholic beverages °-Take any medication unless instructed by your physician °-Make any legal decisions or sign important papers. ° °Meals: °Start with liquid foods such as gelatin or soup. Progress to regular foods as tolerated. Avoid greasy, spicy, heavy foods. If nausea and/or vomiting occur, drink only clear liquids until the nausea and/or vomiting subsides. Call your physician if vomiting continues. ° °Special Instructions/Symptoms: °Your throat may feel dry or sore from the anesthesia or the breathing tube placed in your throat during surgery. If this causes discomfort, gargle with warm salt water. The discomfort should disappear within 24 hours. ° °If you had a scopolamine patch placed behind your ear for the management of post- operative nausea and/or vomiting: ° °1. The medication in the patch is  effective for 72 hours, after which it should be removed.  Wrap patch in a tissue and discard in the trash. Wash hands thoroughly with soap and water. °2. You may remove the patch earlier than 72 hours if you experience unpleasant side effects which may include dry mouth, dizziness or visual disturbances. °3. Avoid touching the patch. Wash your hands with soap and water after contact with the patch. °  ° ° °

## 2017-03-31 NOTE — Transfer of Care (Signed)
Last Vitals:  Vitals:   03/31/17 0901 03/31/17 1115  BP: (!) 142/63 131/68  Pulse: 67 77  Resp: 16 19  Temp: 36.9 C   SpO2: 100% 100%    Last Pain:  Vitals:   03/31/17 0901  TempSrc: Oral      Patients Stated Pain Goal: 6 (03/31/17 0934) Immediate Anesthesia Transfer of Care Note  Patient: Anthony Prince  Procedure(s) Performed: Procedure(s) (LRB): HEMORRHOIDECTOMY (N/A)  Patient Location: PACU  Anesthesia Type: General  Level of Consciousness:drowsy  Airway & Oxygen Therapy: Patient Spontanous Breathing and Patient connected to nasal cannula oxygen  Post-op Assessment: Report given to PACU RN and Post -op Vital signs reviewed and stable  Post vital signs: Reviewed and stable  Complications: No apparent anesthesia complications

## 2017-04-01 ENCOUNTER — Encounter (HOSPITAL_BASED_OUTPATIENT_CLINIC_OR_DEPARTMENT_OTHER): Payer: Self-pay | Admitting: General Surgery

## 2017-04-01 LAB — POCT HEMOGLOBIN-HEMACUE: HEMOGLOBIN: 12.2 g/dL — AB (ref 13.0–17.0)

## 2017-04-04 ENCOUNTER — Telehealth: Payer: Self-pay | Admitting: Surgery

## 2017-04-04 NOTE — Telephone Encounter (Signed)
Mr. Mariann LasterBowden had a hemorrhoidectomy 03/31/2017.  He is having pain, draining and itching. He is doing sitz baths several times a day.  I reassured him that this is typical of hemorrhoid surgery. He is about to run out of pain meds.  He will contact our office for a refill in the AM.  He also has not made an appt for follow up with Dr. Maisie Fushomas. He is to do that.  Ovidio Kinavid Babak Lucus, MD, [facility] Pager: [phone] Office phone:  [phone]

## 2017-04-10 ENCOUNTER — Encounter (HOSPITAL_COMMUNITY): Payer: Self-pay | Admitting: Emergency Medicine

## 2017-04-10 ENCOUNTER — Emergency Department (HOSPITAL_COMMUNITY)
Admission: EM | Admit: 2017-04-10 | Discharge: 2017-04-10 | Disposition: A | Discharge status: Home / Self Care | Payer: Federal, State, Local not specified - PPO | Attending: Emergency Medicine | Admitting: Emergency Medicine

## 2017-04-10 ENCOUNTER — Other Ambulatory Visit: Payer: Self-pay

## 2017-04-10 DIAGNOSIS — G8918 Other acute postprocedural pain: Secondary | ICD-10-CM | POA: Insufficient documentation

## 2017-04-10 DIAGNOSIS — K6289 Other specified diseases of anus and rectum: Secondary | ICD-10-CM | POA: Insufficient documentation

## 2017-04-10 MED ORDER — KETOROLAC TROMETHAMINE 15 MG/ML IJ SOLN
30.0000 mg | Freq: Once | INTRAMUSCULAR | Status: AC
  Administered 2017-04-10: 30 mg via INTRAMUSCULAR
  Filled 2017-04-10: qty 2

## 2017-04-10 MED ORDER — IBUPROFEN 600 MG PO TABS: 600.0000 mg | ORAL_TABLET | Freq: Four times a day (QID) | ORAL | 0 refills | Status: DC | PRN

## 2017-04-10 NOTE — ED Provider Notes (Signed)
[facility] EMERGENCY DEPARTMENT Provider Note   CSN: [id number] Arrival date & time: 04/10/17  1905     History   Chief Complaint Chief Complaint  Patient presents with  . Rectal Pain    HPI Anthony Prince is a 22 y.o. male.  Patient presents with complaint of severe rectal pain. He had a hemorrhoidectomy on 04/04/17 by Dr. Maisie Fus and reports pain and drainage since, but yesterday the pain significantly increased. No fever, abdominal pain, vomiting. He reports rectal bleeding with bowel movement only. No melena. He has been following post-operative care plan including using stool softeners and Miralax, sitz baths, pain medication as prescribed. He states the Percocet he had been given has been giving him relief up until yesterday. His post-op recheck appointment with Dr. Maisie Fus is set for March 4th.    The history is provided by the patient. No language interpreter was used.    Past Medical History:  Diagnosis Date  . Abscess   . GERD (gastroesophageal reflux disease)   . Hemorrhoids     There are no active problems to display for this patient.   Past Surgical History:  Procedure Laterality Date  . HEMORRHOID SURGERY N/A 03/31/2017   Procedure: HEMORRHOIDECTOMY;  Surgeon: Romie Levee, MD;  Location: [facility];  Service: General;  Laterality: N/A;  . SEPTOPLASTY    . WISDOM TOOTH EXTRACTION         Home Medications    Prior to Admission medications   Medication Sig Start Date End Date Taking? Authorizing Provider  oxyCODONE (OXY IR/ROXICODONE) 5 MG immediate release tablet Take 1-2 tablets (5-10 mg total) by mouth every 6 (six) hours as needed. 03/31/17   Romie Levee, MD    Family History Family History  Problem Relation Age of Onset  . Arthritis Mother     Social History Social History   Tobacco Use  . Smoking status: Never Smoker  . Smokeless tobacco: Never Used  Substance Use Topics  . Alcohol use: Yes  . Drug  use: No     Allergies   Patient has no known allergies.   Review of Systems Review of Systems  Constitutional: Negative for chills and fever.  Gastrointestinal: Positive for blood in stool. Negative for abdominal pain and vomiting.  Genitourinary: Negative.   Musculoskeletal: Negative.  Negative for myalgias.  Skin: Negative.   Neurological: Negative.      Physical Exam Updated Vital Signs BP 122/68 (BP Location: Right Arm)   Pulse 88   Temp 98 F (36.7 C)   Resp 15   Ht 6\' 1"  (1.854 m)   Wt 90.7 kg (200 lb)   SpO2 100%   BMI 26.39 kg/m   Physical Exam  Constitutional: He is oriented to person, place, and time. He appears well-developed and well-nourished.  Neck: Normal range of motion.  Pulmonary/Chest: Effort normal.  Genitourinary:  Genitourinary Comments: There is hemorrhoidal tissue externally that is mildly swollen without redness or evidence of thrombosis. Well healed surgical incision centrally. There is a small amount of discharge present that appears thick, yellow, non-bloody - mucus vs purulence. Digital exam is not tolerated by the patient. There is no perianal induration or redness  Musculoskeletal: Normal range of motion.  Neurological: He is alert and oriented to person, place, and time.  Skin: Skin is warm and dry.  Psychiatric: He has a normal mood and affect.     ED Treatments / Results  Labs (all labs ordered are listed, but  only abnormal results are displayed) Labs Reviewed - No data to display  EKG  EKG Interpretation None       Radiology No results found.  Procedures Procedures (including critical care time)  Medications Ordered in ED Medications - No data to display   Initial Impression / Assessment and Plan / ED Course  I have reviewed the triage vital signs and the nursing notes.  Pertinent labs & imaging results that were available during my care of the patient were reviewed by me and considered in my medical decision  making (see chart for details).     The patient presents rectal pain 6 days after hemorrhoidectomy. No fever or evidence of infection. He is not in need of pain medication, he states only that it did not work for the increased pain he's had since yesterday. He is compliant with other forms of post-op care.   No evidence of cutaneous abscess without redness or induration. He had stopped taking ibuprofen and is encouraged to continue with that. IM toradol provided tonight.   Discussed with Dr. Corliss Skainssuei who states that discharge is a normal finding in this situation. Given no sign of infection - fever, abdominal pain, tachycardia - will encourage continuation of regimen of post-op care and addition of ibuprofen.    Final Clinical Impressions(s) / ED Diagnoses   Final diagnoses:  None   1. Post-operative pain  ED Discharge Orders    None       Elpidio AnisUpstill, Artem Bunte, Cordelia Poche-C 04/11/17 [id number]    Eber HongMiller, Brian, MD 04/11/17 1501

## 2017-04-10 NOTE — ED Notes (Signed)
ED Provider at bedside. 

## 2017-04-10 NOTE — ED Triage Notes (Signed)
Pt states he had surgery on his rectum on the 2/13 and today he is 10/10 rectal pain with bleeding.

## 2019-03-01 ENCOUNTER — Emergency Department (INDEPENDENT_AMBULATORY_CARE_PROVIDER_SITE_OTHER)
Admission: EM | Admit: 2019-03-01 | Discharge: 2019-03-01 | Disposition: A | Discharge status: Home / Self Care | Payer: Federal, State, Local not specified - PPO | Source: Home / Self Care

## 2019-03-01 ENCOUNTER — Other Ambulatory Visit: Payer: Self-pay

## 2019-03-01 ENCOUNTER — Encounter: Payer: Self-pay | Admitting: Family Medicine

## 2019-03-01 DIAGNOSIS — R3989 Other symptoms and signs involving the genitourinary system: Secondary | ICD-10-CM | POA: Diagnosis not present

## 2019-03-01 DIAGNOSIS — N342 Other urethritis: Secondary | ICD-10-CM

## 2019-03-01 LAB — POCT URINALYSIS DIP (MANUAL ENTRY)
Bilirubin, UA: NEGATIVE
Glucose, UA: NEGATIVE mg/dL
Ketones, POC UA: NEGATIVE mg/dL
Leukocytes, UA: NEGATIVE
Nitrite, UA: NEGATIVE
Protein Ur, POC: NEGATIVE mg/dL
Spec Grav, UA: 1.01 (ref 1.010–1.025)
Urobilinogen, UA: 0.2 E.U./dL
pH, UA: 6 (ref 5.0–8.0)

## 2019-03-01 MED ORDER — DOXYCYCLINE HYCLATE 100 MG PO TABS: 100.0000 mg | ORAL_TABLET | Freq: Two times a day (BID) | ORAL | 0 refills | Status: DC

## 2019-03-01 NOTE — ED Provider Notes (Addendum)
[name]    CSN: [id number] Arrival date & time: 03/01/19  1459      History   Chief Complaint Chief Complaint  Patient presents with  . STD test    HPI Anthony Prince is a 24 y.o. male.   Initial [facility] urgent care visit for this 24 year old man.  24 yo man complaining of urinary problems.  He says that he feels that there is incomplete emptying from his penis.  He had oral sex for the first time a couple days ago.  He has had no intercourse.     Past Medical History:  Diagnosis Date  . Abscess   . GERD (gastroesophageal reflux disease)   . Hemorrhoids     There are no problems to display for this patient.   Past Surgical History:  Procedure Laterality Date  . HEMORRHOID SURGERY N/A 03/31/2017   Procedure: HEMORRHOIDECTOMY;  Surgeon: Leighton Ruff, MD;  Location[facility];  Service: General;  Laterality: N/A;  . SEPTOPLASTY    . WISDOM TOOTH EXTRACTION         Home Medications    Prior to Admission medications   Medication Sig Start Date End Date Taking? Authorizing Provider  doxycycline (VIBRA-TABS) 100 MG tablet Take 1 tablet (100 mg total) by mouth 2 (two) times daily. 03/01/19   Robyn Haber, MD  ibuprofen (ADVIL,MOTRIN) 600 MG tablet Take 1 tablet (600 mg total) by mouth every 6 (six) hours as needed. 04/10/17   Charlann Lange, PA-C    Family History Family History  Problem Relation Age of Onset  . Arthritis Mother     Social History Social History   Tobacco Use  . Smoking status: Never Smoker  . Smokeless tobacco: Never Used  Substance Use Topics  . Alcohol use: Yes  . Drug use: No     Allergies   Patient has no known allergies.   Review of Systems Review of Systems   Physical Exam Triage Vital Signs ED Triage Vitals  Enc Vitals Group     BP      Pulse      Resp      Temp      Temp src      SpO2      Weight      Height      Head Circumference      Peak Flow      Pain Score        Pain Loc      Pain Edu?      Excl. in [place]?    No data found.  Updated Vital Signs BP (!) 157/77 (BP Location: Right Arm)   Pulse 82   Temp 98.1 F (36.7 C)   Resp 20   Ht 6\' 1"  (1.854 m)   Wt 90.3 kg   SpO2 99%   BMI 26.25 kg/m    Physical Exam Vitals and nursing note reviewed.  Constitutional:      Appearance: Normal appearance. He is normal weight.  Cardiovascular:     Rate and Rhythm: Normal rate.  Pulmonary:     Effort: Pulmonary effort is normal.  Abdominal:     General: Abdomen is flat.     Hernia: No hernia is present.  Genitourinary:    Penis: Normal.      Testes: Normal.  Musculoskeletal:     Cervical back: Normal range of motion and neck supple.  Neurological:     Mental Status: He is alert.  UC Treatments / Results  Labs (all labs ordered are listed, but only abnormal results are displayed) Labs Reviewed  C. TRACHOMATIS/N. GONORRHOEAE RNA  HIV ANTIBODY (ROUTINE TESTING W REFLEX)  RPR  POCT URINALYSIS DIP (MANUAL ENTRY)  CYTOLOGY, (ORAL, ANAL, URETHRAL) ANCILLARY ONLY    EKG   Radiology No results found.  Procedures Procedures (including critical care time)  Medications Ordered in UC Medications - No data to display  Initial Impression / Assessment and Plan / UC Course  I have reviewed the triage vital signs and the nursing notes.  Pertinent labs & imaging results that were available during my care of the patient were reviewed by me and considered in my medical decision making (see chart for details).    Final Clinical Impressions(s) / UC Diagnoses   Final diagnoses:  Urethral pain  Urethritis     Discharge Instructions     Get started on the antibiotics tonight.  You should have lab reports ready either late tomorrow or Friday.    ED Prescriptions    Medication Sig Dispense Auth. Provider   doxycycline (VIBRA-TABS) 100 MG tablet Take 1 tablet (100 mg total) by mouth 2 (two) times daily. 14 tablet Elvina Sidle, MD     I have reviewed the PDMP during this encounter.   Elvina Sidle, MD 03/01/19 1532    Elvina Sidle, MD 03/01/19 1535

## 2019-03-01 NOTE — Discharge Instructions (Addendum)
Get started on the antibiotics tonight.  You should have lab reports ready either late tomorrow or Friday.

## 2019-03-01 NOTE — ED Triage Notes (Signed)
Pt states that he noticed a sensation in his urethra 2 days ago of feeling like there is urine still in urethra.  He had oral sex Jan 5, and this sensation was after that. Also had rough masturbation at that time.

## 2019-03-02 LAB — HIV ANTIBODY (ROUTINE TESTING W REFLEX): HIV 1&2 Ab, 4th Generation: NONREACTIVE

## 2019-03-02 LAB — C. TRACHOMATIS/N. GONORRHOEAE RNA
C. trachomatis RNA, TMA: NOT DETECTED
N. gonorrhoeae RNA, TMA: NOT DETECTED

## 2019-03-02 LAB — RPR: RPR Ser Ql: NONREACTIVE

## 2019-03-15 ENCOUNTER — Telehealth: Payer: Self-pay

## 2019-03-15 NOTE — Telephone Encounter (Signed)
Pt still having problems that he was seen for in UC 2 weeks ago. Finished the doxycycline, all labs neg. Per Langston Masker, should see [facility] for further eval of urethral pain. Pt given phone number and acknowledged advice.

## 2019-03-18 ENCOUNTER — Telehealth: Payer: Federal, State, Local not specified - PPO | Admitting: Family

## 2019-03-18 DIAGNOSIS — R369 Urethral discharge, unspecified: Secondary | ICD-10-CM

## 2019-03-18 DIAGNOSIS — R300 Dysuria: Secondary | ICD-10-CM

## 2019-03-18 NOTE — Progress Notes (Signed)
Based on what you shared with me, I feel your condition warrants further evaluation and I recommend that you be seen for a face to face office visit.   NOTE: If you entered your credit card information for this eVisit, you will not be charged. You may see a "hold" on your card for the $35 but that hold will drop off and you will not have a charge processed.  Given that you were having burning with urination and discharge you need to be seen face-to-face to be evaluated for more serious infection.   If you are having a true medical emergency please call 911.      For an urgent face to face visit, [facility] has five urgent care centers for your convenience:      NEW:  [facility] at [facility] Directions [phone] [address] [place], [place] [address] . 10 am - 6pm Monday - Friday    [facility]) Get Driving Directions [phone] [address] [place], [place] [address] . 10 am to 8 pm Monday-Friday . 12 pm to 8 pm [facility] Urgent Care at [facility] Get Driving Directions [phone] [address], [address] [place], [place] [address] . 8 am to 8 pm Monday-Friday . 9 am to 6 pm Saturday . 11 am to 6 pm Sunday     [facility] at [facility] Get Driving Directions  [phone] [address] [place], [place] [address] . 8 am to 8 pm Monday-Friday . 8 am to 4 pm [facility] Urgent Care at [facility] Directions [phone] [address] [place], [place] [address] . 12 pm to 6 pm Monday-Friday      Your e-visit answers were reviewed by a board certified advanced clinical practitioner to complete your personal care plan.  Thank you for using e-Visits.

## 2019-03-19 ENCOUNTER — Emergency Department (HOSPITAL_COMMUNITY): Payer: Federal, State, Local not specified - PPO

## 2019-03-19 ENCOUNTER — Other Ambulatory Visit: Payer: Self-pay

## 2019-03-19 ENCOUNTER — Encounter (HOSPITAL_COMMUNITY): Payer: Self-pay | Admitting: Emergency Medicine

## 2019-03-19 ENCOUNTER — Emergency Department (HOSPITAL_COMMUNITY)
Admission: EM | Admit: 2019-03-19 | Discharge: 2019-03-19 | Disposition: A | Discharge status: Home / Self Care | Payer: Federal, State, Local not specified - PPO | Attending: Emergency Medicine | Admitting: Emergency Medicine

## 2019-03-19 DIAGNOSIS — N50812 Left testicular pain: Secondary | ICD-10-CM | POA: Diagnosis not present

## 2019-03-19 DIAGNOSIS — R1032 Left lower quadrant pain: Secondary | ICD-10-CM

## 2019-03-19 LAB — URINALYSIS, ROUTINE W REFLEX MICROSCOPIC
Bilirubin Urine: NEGATIVE
Glucose, UA: NEGATIVE mg/dL
Hgb urine dipstick: NEGATIVE
Ketones, ur: NEGATIVE mg/dL
Leukocytes,Ua: NEGATIVE
Nitrite: NEGATIVE
Protein, ur: NEGATIVE mg/dL
Specific Gravity, Urine: 1.009 (ref 1.005–1.030)
pH: 6 (ref 5.0–8.0)

## 2019-03-19 MED ORDER — OXYCODONE-ACETAMINOPHEN 5-325 MG PO TABS
1.0000 | ORAL_TABLET | Freq: Once | ORAL | Status: AC
  Administered 2019-03-19: 21:00:00 1 via ORAL
  Filled 2019-03-19: qty 1

## 2019-03-19 NOTE — Discharge Instructions (Addendum)
Recommend follow-up with primary doctor regarding symptoms you had today.  Return to ER if symptoms worsen, you develop testicular pain or swelling, abdominal pain or swelling, fever or other new concerning symptom.

## 2019-03-19 NOTE — ED Notes (Signed)
Patient verbalizes understanding of discharge instructions. Opportunity for questioning and answers were provided.  pt discharged from ED, ambulatory by self   

## 2019-03-19 NOTE — ED Triage Notes (Signed)
Patient reports left groin pain onset this week , denies injury , no hematuria or dysuria , patient added skin itching at anal area.

## 2019-03-19 NOTE — ED Provider Notes (Signed)
[facility] EMERGENCY DEPARTMENT Provider Note   CSN: [id number] Arrival date & time: 03/19/19  1906     History Chief Complaint  Patient presents with  . Groin Pain    Anthony Prince is a 23 y.o. male.  Presents ER with complaints of left groin pain.  Patient states symptoms ongoing for the past week or so.  Radiates from left groin to left testicle.  Has not noted any injuries to this area, no swelling, no rashes, no penile drainage, no dysuria or hematuria.  Denies known history of appendicitis, no past surgical problems.  No chronic medical problems.  HPI     Past Medical History:  Diagnosis Date  . Abscess   . GERD (gastroesophageal reflux disease)   . Hemorrhoids     There are no problems to display for this patient.   Past Surgical History:  Procedure Laterality Date  . HEMORRHOID SURGERY N/A 03/31/2017   Procedure: HEMORRHOIDECTOMY;  Surgeon: Leighton Ruff, MD;  Location: [facility];  Service: General;  Laterality: N/A;  . SEPTOPLASTY    . WISDOM TOOTH EXTRACTION         Family History  Problem Relation Age of Onset  . Arthritis Mother     Social History   Tobacco Use  . Smoking status: Never Smoker  . Smokeless tobacco: Never Used  Substance Use Topics  . Alcohol use: Yes  . Drug use: No    Home Medications Prior to Admission medications   Medication Sig Start Date End Date Taking? Authorizing Provider  CRANBERRY PO Take 1 tablet by mouth daily.   Yes [provider]  ibuprofen (ADVIL) 200 MG tablet Take 200 mg by mouth every 6 (six) hours as needed for headache (pain).   Yes [provider]  SILDENAFIL CITRATE PO Take 30 mg by mouth daily as needed (erectile dysfunction).   Yes [provider]    Allergies    Patient has no known allergies.  Review of Systems   Review of Systems  Constitutional: Negative for chills and fever.  HENT: Negative for ear pain and sore throat.     Eyes: Negative for pain and visual disturbance.  Respiratory: Negative for cough and shortness of breath.   Cardiovascular: Negative for chest pain and palpitations.  Gastrointestinal: Negative for abdominal pain and vomiting.  Genitourinary: Positive for testicular pain. Negative for dysuria and hematuria.  Musculoskeletal: Negative for arthralgias and back pain.  Skin: Negative for color change and rash.  Neurological: Negative for seizures and syncope.  All other systems reviewed and are negative.   Physical Exam Updated Vital Signs BP 135/80 (BP Location: Right Arm)   Pulse 71   Temp 98.6 F (37 C) (Oral)   Resp 18   SpO2 98%   Physical Exam Vitals and nursing note reviewed.  Constitutional:      Appearance: He is well-developed.  HENT:     Head: Normocephalic and atraumatic.  Eyes:     Conjunctiva/sclera: Conjunctivae normal.  Cardiovascular:     Rate and Rhythm: Normal rate and regular rhythm.     Heart sounds: No murmur.  Pulmonary:     Effort: Pulmonary effort is normal. No respiratory distress.     Breath sounds: Normal breath sounds.  Abdominal:     Palpations: Abdomen is soft.     Tenderness: There is no abdominal tenderness.     Comments: No tenderness throughout abdomen  Genitourinary:    Comments: Penis appears normal,  bilateral cremasteric reflex intact, mild tenderness over left testicle, no tenderness over right testicle, no scrotal swelling, no erythema, no hernia Musculoskeletal:     Cervical back: Neck supple.  Skin:    General: Skin is warm and dry.  Neurological:     Mental Status: He is alert.   Shyniece chaperone  ED Results / Procedures / Treatments   Labs (all labs ordered are listed, but only abnormal results are displayed) Labs Reviewed  URINALYSIS, ROUTINE W REFLEX MICROSCOPIC - Abnormal; Notable for the following components:      Result Value   Color, Urine STRAW (*)    All other components within normal limits     EKG None  Radiology US SCROTUM W/DOPPLER  Result Date: 03/19/2019 CLINICAL DATA:  Initial evaluation for acute left groin/scrotal pain. EXAM: SCROTAL ULTRASOUND DOPPLER ULTRASOUND OF THE TESTICLES TECHNIQUE: Complete ultrasound examination of the testicles, epididymis, and other scrotal structures was performed. Color and spectral Doppler ultrasound were also utilized to evaluate blood flow to the testicles. COMPARISON:  None. FINDINGS: Right testicle Measurements: 4.7 x 2.4 x 2.9 cm. No mass or microlithiasis visualized. Left testicle Measurements: 4.4 x 2.3 x 2.8 cm. No mass or microlithiasis visualized. Right epididymis:  Normal in size and appearance. Left epididymis:  Normal in size and appearance. Hydrocele:  None visualized. Varicocele:  None visualized. Pulsed Doppler interrogation of both testes demonstrates normal low resistance arterial and venous waveforms bilaterally. IMPRESSION: Normal scrotal ultrasound. No evidence for torsion or other acute abnormality. Electronically Signed   By: Rise Mu M.D.   On: 03/19/2019 21:26    Procedures Procedures (including critical care time)  Medications Ordered in ED Medications  oxyCODONE-acetaminophen (PERCOCET/ROXICET) 5-325 MG per tablet 1 tablet (1 tablet Oral Given 03/19/19 2045)    ED Course  I have reviewed the triage vital signs and the nursing notes.  Pertinent labs & imaging results that were available during my care of the patient were reviewed by me and considered in my medical decision making (see chart for details).    MDM Rules/Calculators/A&P                      24 year old male presenting to ER with complaints of left groin pain radiating to left testicle.  On exam he is very well-appearing, I do not appreciate any physical exam abnormalities.  Urinalysis was negative.  Scrotal/testicular ultrasound was normal.  Unclear etiology for patient's symptoms today.  However given these findings and my physical  exam, feel patient is appropriate for discharge and outpatient follow-up at this time.    After the discussed management above, the patient was determined to be safe for discharge.  The patient was in agreement with this plan and all questions regarding their care were answered.  ED return precautions were discussed and the patient will return to the ED with any significant worsening of condition.   Final Clinical Impression(s) / ED Diagnoses Final diagnoses:  Left inguinal pain    Rx / DC Orders ED Discharge Orders    None       Milagros Loll, MD 03/19/19 2332

## 2019-06-19 ENCOUNTER — Telehealth: Payer: Federal, State, Local not specified - PPO | Admitting: Physician Assistant

## 2019-06-19 DIAGNOSIS — R300 Dysuria: Secondary | ICD-10-CM

## 2019-06-19 NOTE — Progress Notes (Signed)
Based on what you shared with me, I feel your condition warrants further evaluation and I recommend that you be seen for a face to face office visit.  Additionally you will need a urinalysis, urine culture and possible STD testing. Choosing the correct antibiotic cannot be done without this information.  Local Urgent Care options are listed below or you may be evaluated by your PCP or in the emergency department.   NOTE: If you entered your credit card information for this eVisit, you will not be charged. You may see a "hold" on your card for the $35 but that hold will drop off and you will not have a charge processed.   If you are having a true medical emergency please call 911.      For an urgent face to face visit, [facility] has five urgent care centers for your convenience:      NEW:  [facility] at [facility] Directions [phone] [address] [place], [place] [address] . 10 am - 6pm Monday - Friday    [facility] Center [facility]) Get Driving Directions [phone] [address] [place], [place] [address] . 10 am to 8 pm Monday-Friday . 12 pm to 8 pm [facility] Urgent Care at [facility] Get Driving Directions [phone] [address] [place], [place] [address] . 8 am to 8 pm Monday-Friday . 9 am to 6 pm Saturday . 11 am to 6 pm Sunday     [facility] Health Urgent Care at [facility] Get Driving Directions  [phone] [address] [place], [place] [address] . 8 am to 8 pm Monday-Friday . 8 am to 4 pm [facility] Urgent Care at [facility] Directions [phone] [address] [place], [place] [address] . 12 pm to 6 pm Monday-Friday      Your e-visit answers were reviewed by a board certified advanced clinical practitioner to complete your personal care plan.  Thank you for using e-Visits.   Greater  than 5 minutes, yet less than 10 minutes of time have been spent researching, coordinating, and implementing care for this patient today

## 2020-03-19 IMAGING — US US SCROTUM W/ DOPPLER COMPLETE
1 series · 14 of 25 positions shown · non-contrast
Comparison: None.

CLINICAL DATA: Initial evaluation for acute left groin/scrotal
pain.

EXAM:
SCROTAL ULTRASOUND
DOPPLER ULTRASOUND OF THE TESTICLES
TECHNIQUE: Complete ultrasound examination of the testicles, epididymis, and
other scrotal structures was performed. Color and spectral Doppler
ultrasound were also utilized to evaluate blood flow to the
testicles.

[Series 1: us scrotum w/ doppler complete · 14 of 49 slices shown]
[im 1/49]
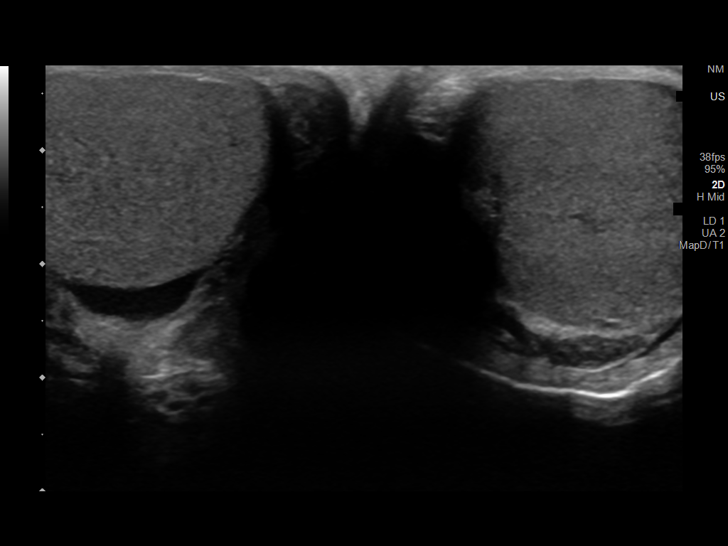
[im 5/49]
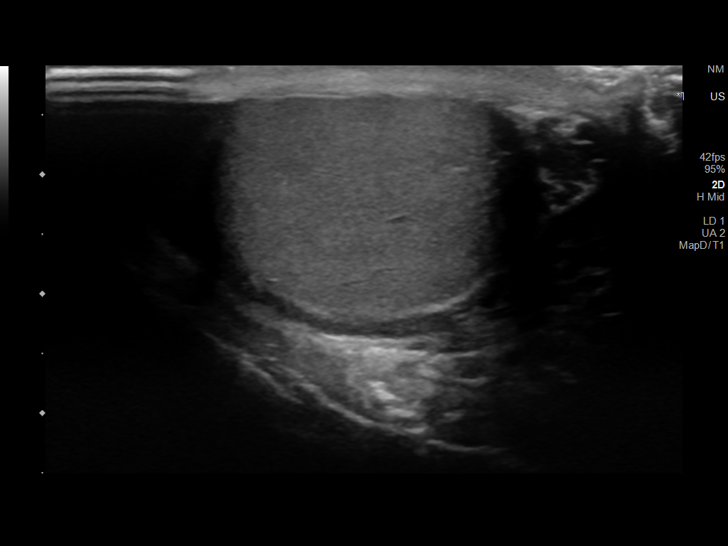
[im 9/49]
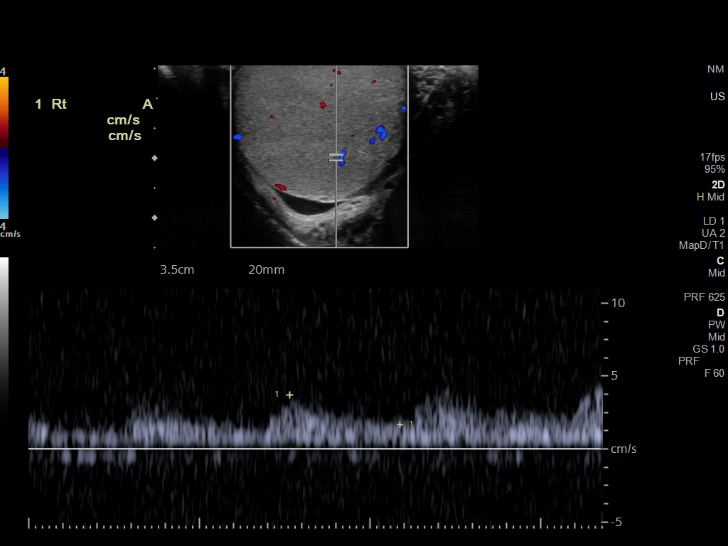
[im 13/49]
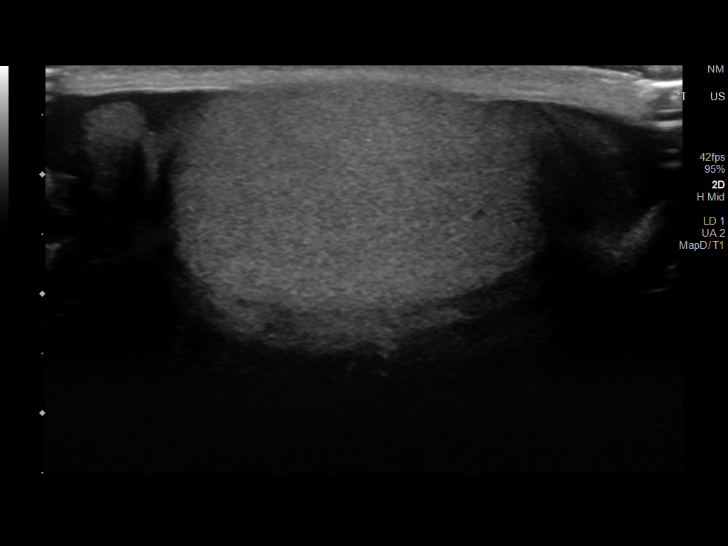
[im 17/49]
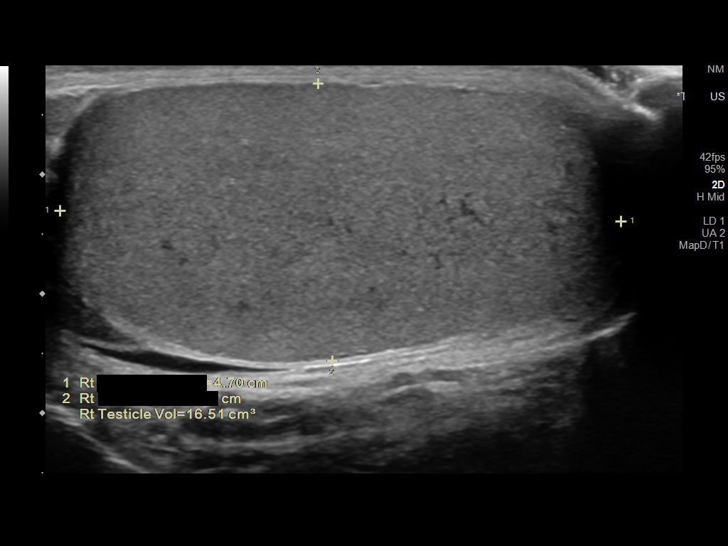
[im 19/49]
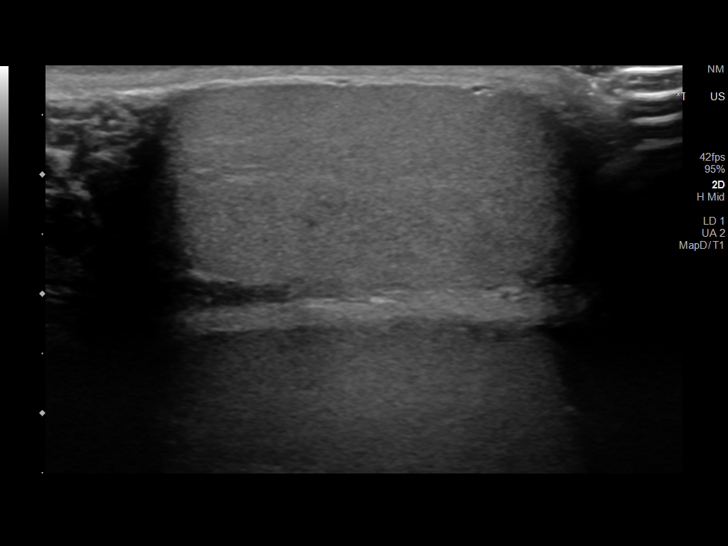
[im 23/49]
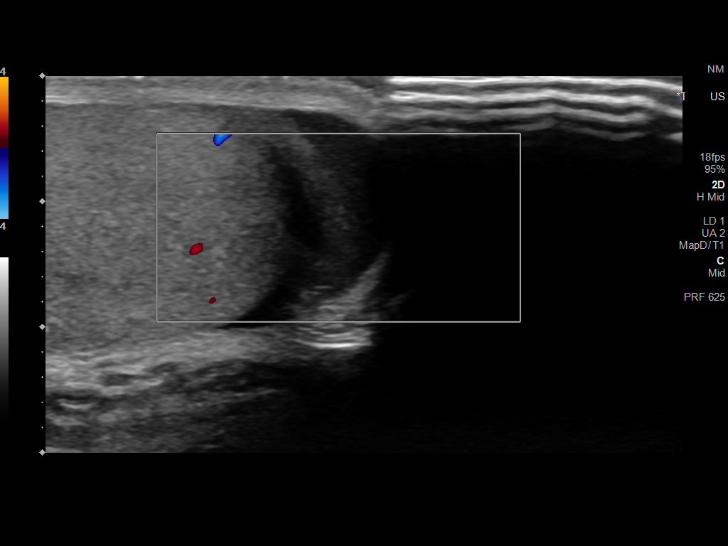
[im 27/49]
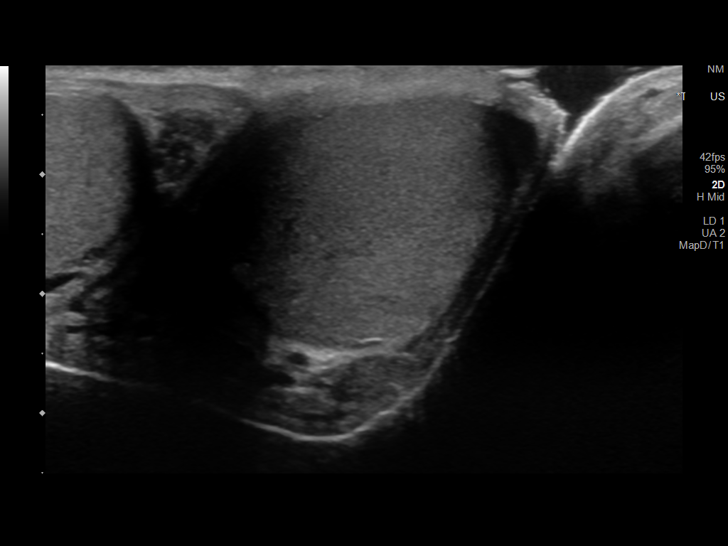
[im 31/49]
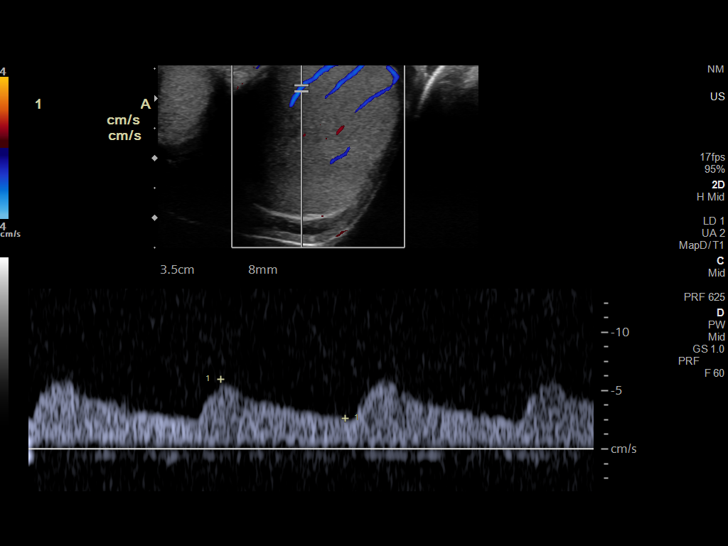
[im 33/49]
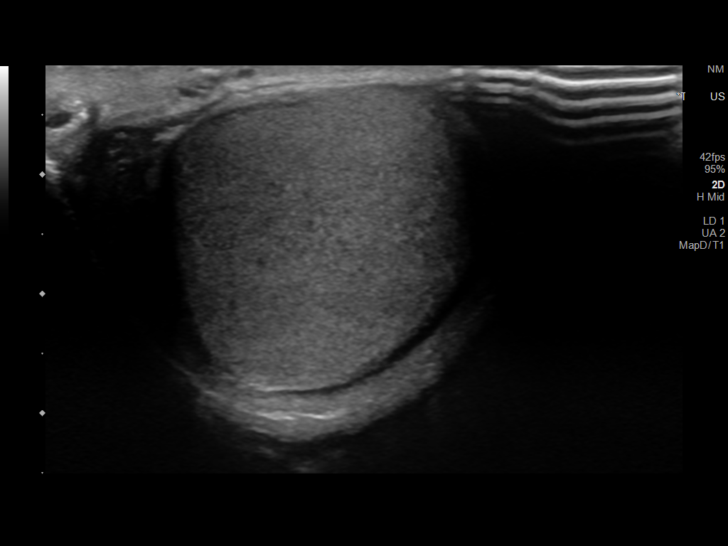
[im 37/49]
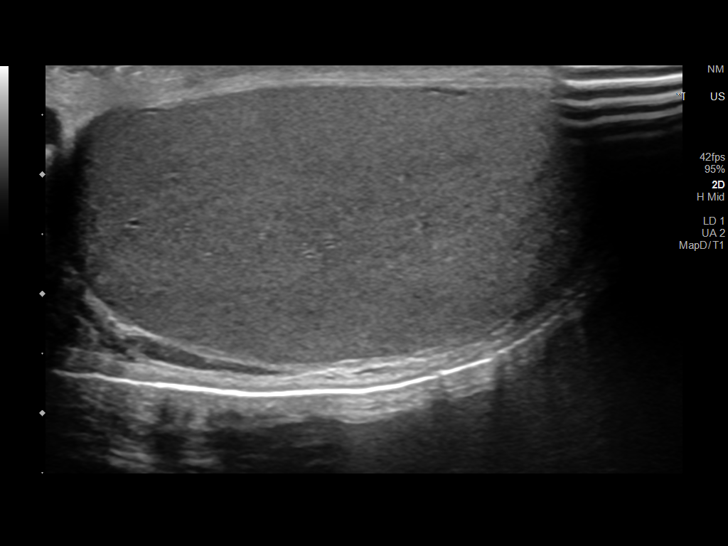
[im 41/49]
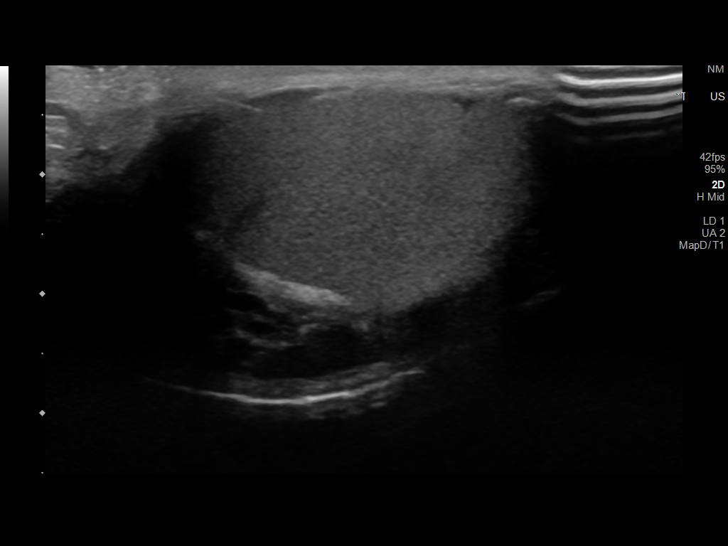
[im 45/49]
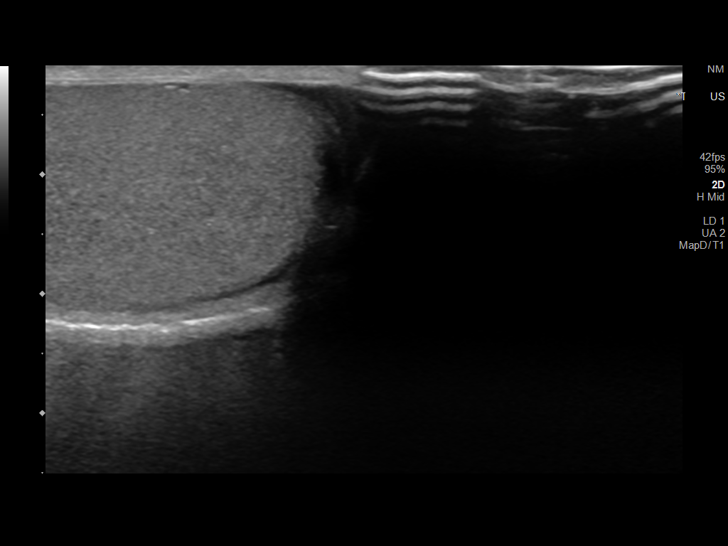
[im 49/49]
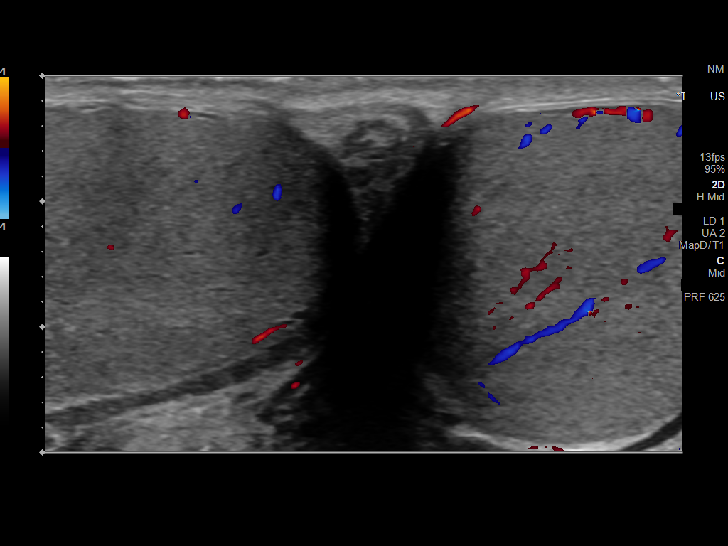

[14 of 25 positions shown; findings below may reference images not displayed]

FINDINGS: Right testicle

Measurements: 4.7 x 2.4 x 2.9 cm. No mass or microlithiasis
visualized.

Left testicle

Measurements: 4.4 x 2.3 x 2.8 cm. No mass or microlithiasis
visualized.

Right epididymis:  Normal in size and appearance.

Left epididymis:  Normal in size and appearance.

Hydrocele:  None visualized.

Varicocele:  None visualized.

Pulsed Doppler interrogation of both testes demonstrates normal low
resistance arterial and venous waveforms bilaterally.
IMPRESSION: Normal scrotal ultrasound. No evidence for torsion or other acute
abnormality.
